# Patient Record
Sex: Female | Born: 1980
Health system: Southern US, Community
[De-identification: ages and names within clinical notes are randomized; demographics above are authoritative.]

---

## 2017-10-26 ENCOUNTER — Emergency Department (HOSPITAL_COMMUNITY): Payer: 59

## 2017-10-26 ENCOUNTER — Encounter (HOSPITAL_COMMUNITY): Payer: Self-pay | Admitting: Emergency Medicine

## 2017-10-26 ENCOUNTER — Emergency Department (HOSPITAL_COMMUNITY)
Admission: EM | Admit: 2017-10-26 | Discharge: 2017-10-26 | Disposition: A | Discharge status: Home / Self Care | Payer: 59 | Attending: Emergency Medicine | Admitting: Emergency Medicine

## 2017-10-26 DIAGNOSIS — F17200 Nicotine dependence, unspecified, uncomplicated: Secondary | ICD-10-CM | POA: Insufficient documentation

## 2017-10-26 DIAGNOSIS — J40 Bronchitis, not specified as acute or chronic: Secondary | ICD-10-CM | POA: Diagnosis not present

## 2017-10-26 DIAGNOSIS — M7918 Myalgia, other site: Secondary | ICD-10-CM | POA: Insufficient documentation

## 2017-10-26 DIAGNOSIS — R05 Cough: Secondary | ICD-10-CM | POA: Diagnosis present

## 2017-10-26 MED ORDER — IPRATROPIUM-ALBUTEROL 0.5-2.5 (3) MG/3ML IN SOLN
3.0000 mL | Freq: Once | RESPIRATORY_TRACT | Status: AC
  Administered 2017-10-26: 3 mL via RESPIRATORY_TRACT
  Filled 2017-10-26: qty 3

## 2017-10-26 MED ORDER — BENZONATATE 100 MG PO CAPS: 100.0000 mg | ORAL_CAPSULE | Freq: Three times a day (TID) | ORAL | 0 refills | Status: AC

## 2017-10-26 MED ORDER — PREDNISONE 10 MG PO TABS: 40.0000 mg | ORAL_TABLET | Freq: Every day | ORAL | 0 refills | Status: AC

## 2017-10-26 MED ORDER — AZITHROMYCIN 250 MG PO TABS: ORAL_TABLET | ORAL | 0 refills | Status: AC

## 2017-10-26 NOTE — ED Provider Notes (Signed)
[facility]-EMERGENCY DEPT Provider Note   CSN: [id number] Arrival date & time: 10/26/17  1526     History   Chief Complaint Chief Complaint  Patient presents with  . Cough  . Generalized Body Aches    HPI Sabrina Chandler is a 37 y.o. female every day smoker who presents to the ED with flu like symptoms. Patient reports a productive cough with yellow/green sputum. She reports chills and thinks she had fever at home. Patient has been taking motrin for fever and aches.   The history is provided by the patient. No language interpreter was used.  Cough  This is a new problem. The current episode started more than 2 days ago. The cough is productive of purulent sputum. The maximum temperature recorded prior to her arrival was 100 to 100.9 F. Associated symptoms include chills, headaches, sore throat and myalgias. Pertinent negatives include no chest pain and no shortness of breath. She has tried decongestants for the symptoms. The treatment provided mild relief. She is a smoker.    History reviewed. No pertinent past medical history.  There are no active problems to display for this patient.   History reviewed. No pertinent surgical history.   OB History   None      Home Medications    Prior to Admission medications   Medication Sig Start Date End Date Taking? Authorizing Provider  Pseudoeph-Doxylamine-DM-APAP (NYQUIL PO) Take 2 capsules by mouth at bedtime as needed (cold and flu symptoms).   Yes [provider]  Pseudoephedrine-APAP-DM (DAYQUIL PO) Take 2 capsules by mouth daily as needed (cold and flu symptoms).   Yes [provider]  azithromycin (ZITHROMAX Z-PAK) 250 MG tablet Take the first 2 tablets now and then one tablet PO daily 10/26/17   Janne Napoleon, NP  benzonatate (TESSALON) 100 MG capsule Take 1 capsule (100 mg total) by mouth every 8 (eight) hours. 10/26/17   Janne Napoleon, NP  predniSONE (DELTASONE) 10 MG tablet Take 4  tablets (40 mg total) by mouth daily with breakfast. 10/26/17   Janne Napoleon, NP    Family History No family history on file.  Social History Social History   Tobacco Use  . Smoking status: Never Smoker  . Smokeless tobacco: Never Used  Substance Use Topics  . Alcohol use: Not on file  . Drug use: Not on file     Allergies   Patient has no known allergies.   Review of Systems Review of Systems  Constitutional: Positive for chills and fever.  HENT: Positive for congestion and sore throat.   Eyes: Negative for pain, discharge and visual disturbance.  Respiratory: Positive for cough. Negative for shortness of breath.   Cardiovascular: Negative for chest pain.  Gastrointestinal: Negative for abdominal pain and vomiting.  Genitourinary: Negative for dysuria and urgency.  Musculoskeletal: Positive for myalgias. Negative for back pain and neck pain.  Skin: Negative for rash.  Neurological: Positive for headaches. Negative for syncope.  Psychiatric/Behavioral: Negative for confusion.     Physical Exam Updated Vital Signs BP 100/72 (BP Location: Left Arm)   Pulse 91   Temp 99.1 F (37.3 C) (Oral)   Resp 14   LMP 10/24/2017   SpO2 98%   Physical Exam  Constitutional: She appears well-developed and well-nourished. No distress.  HENT:  Head: Normocephalic and atraumatic.  Right Ear: Tympanic membrane normal.  Left Ear: Tympanic membrane normal.  Nose: Mucosal edema and rhinorrhea present.  Mouth/Throat: Uvula is midline and  mucous membranes are normal. Posterior oropharyngeal erythema present. No posterior oropharyngeal edema.  Eyes: EOM are normal.  Neck: Neck supple.  Cardiovascular: Normal rate and regular rhythm.  Pulmonary/Chest: Effort normal. She has decreased breath sounds.  Abdominal: Soft. There is no tenderness.  Musculoskeletal: Normal range of motion.  Neurological: She is alert.  Skin: Skin is warm and dry.  Psychiatric: She has a normal mood and  affect. Her behavior is normal.  Nursing note and vitals reviewed.    ED Treatments / Results  Labs (all labs ordered are listed, but only abnormal results are displayed) Labs Reviewed - No data to display  Radiology Dg Chest 2 View  Result Date: 10/26/2017 CLINICAL DATA:  Shortness of breath with chest discomfort, congestion and coughing 3 days. EXAM: CHEST - 2 VIEW COMPARISON:  None. FINDINGS: The heart size and mediastinal contours are within normal limits. Both lungs are clear. The visualized skeletal structures are unremarkable. IMPRESSION: No active cardiopulmonary disease. Electronically Signed   By: Elberta Fortisaniel  Boyle M.D.   On: 10/26/2017 17:54    Procedures Procedures (including critical care time)  Medications Ordered in ED Medications  ipratropium-albuterol (DUONEB) 0.5-2.5 (3) MG/3ML nebulizer solution 3 mL (3 mLs Nebulization Given 10/26/17 1749)     Initial Impression / Assessment and Plan / ED Course  I have reviewed the triage vital signs and the nursing notes. Pt CXR negative for acute infiltrate. Patients symptoms are consistent with bronchitis. Discussed that since she is an every day smoker she is at increased risk for infection. Will treat with Tessalon, Z-pak and short course of prednisone. Patient verbalizes understanding and is agreeable with plan. No respiratory distress and 02 SAT 98% on R/A.  Pt is hemodynamically stable & in NAD prior to dc.  Final Clinical Impressions(s) / ED Diagnoses   Final diagnoses:  Bronchitis    ED Discharge Orders         Ordered    benzonatate (TESSALON) 100 MG capsule  Every 8 hours     10/26/17 1802    azithromycin (ZITHROMAX Z-PAK) 250 MG tablet     10/26/17 1802    predniSONE (DELTASONE) 10 MG tablet  Daily with breakfast     10/26/17 1802           Damian Leavelleese, ReddellHope M, NP 10/26/17 1809    Lorre NickAllen, Anthony, MD 10/26/17 2337

## 2017-10-26 NOTE — Discharge Instructions (Addendum)
Take the medication as directed. Follow up with your doctor in the next few days for recheck. Return here as needed.

## 2019-07-22 IMAGING — CR DG CHEST 2V
2 series · 2 of 2 positions shown · non-contrast
Comparison: None.

CLINICAL DATA: Shortness of breath with chest discomfort,
congestion and coughing 3 days.

EXAM:
CHEST - 2 VIEW

[w chest pa]
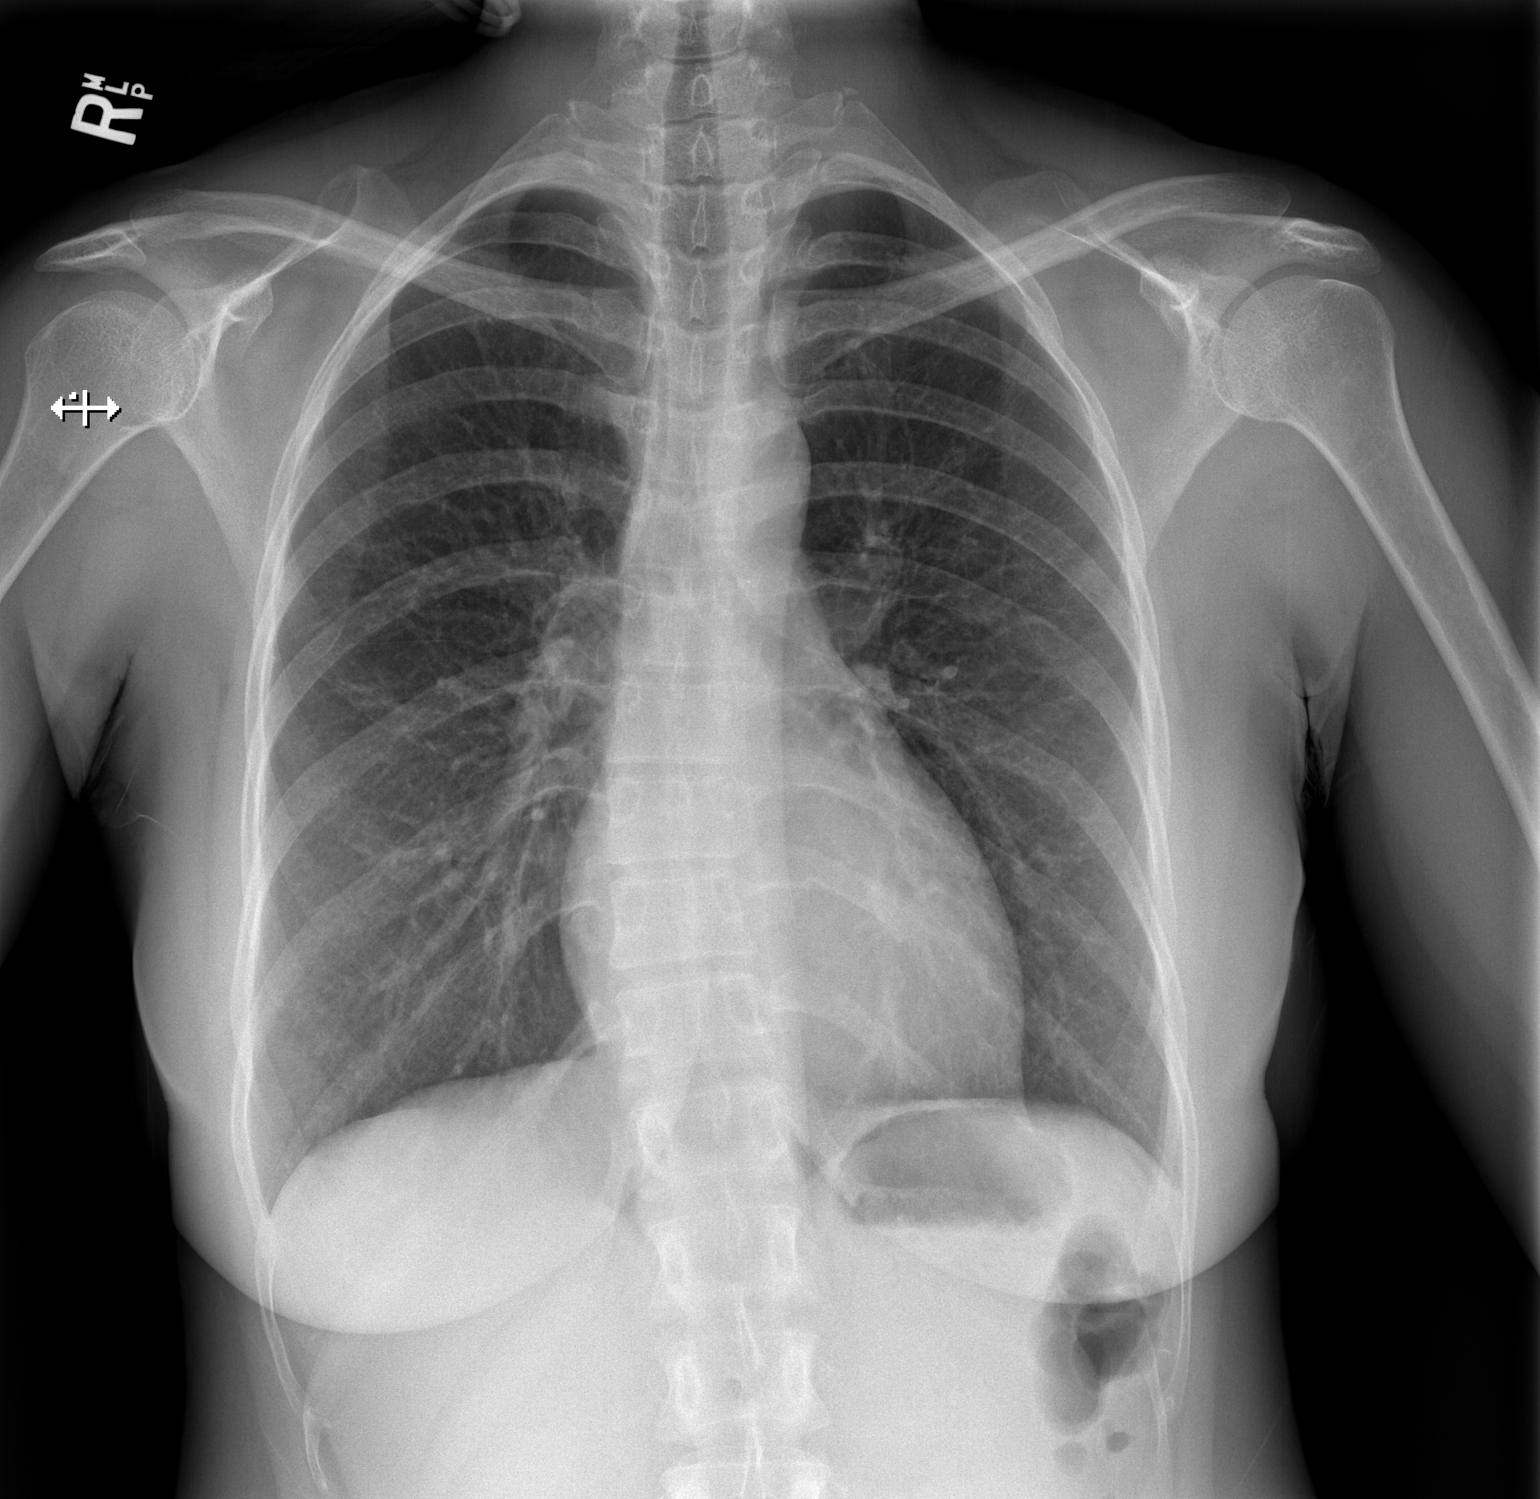

[w chest lat]
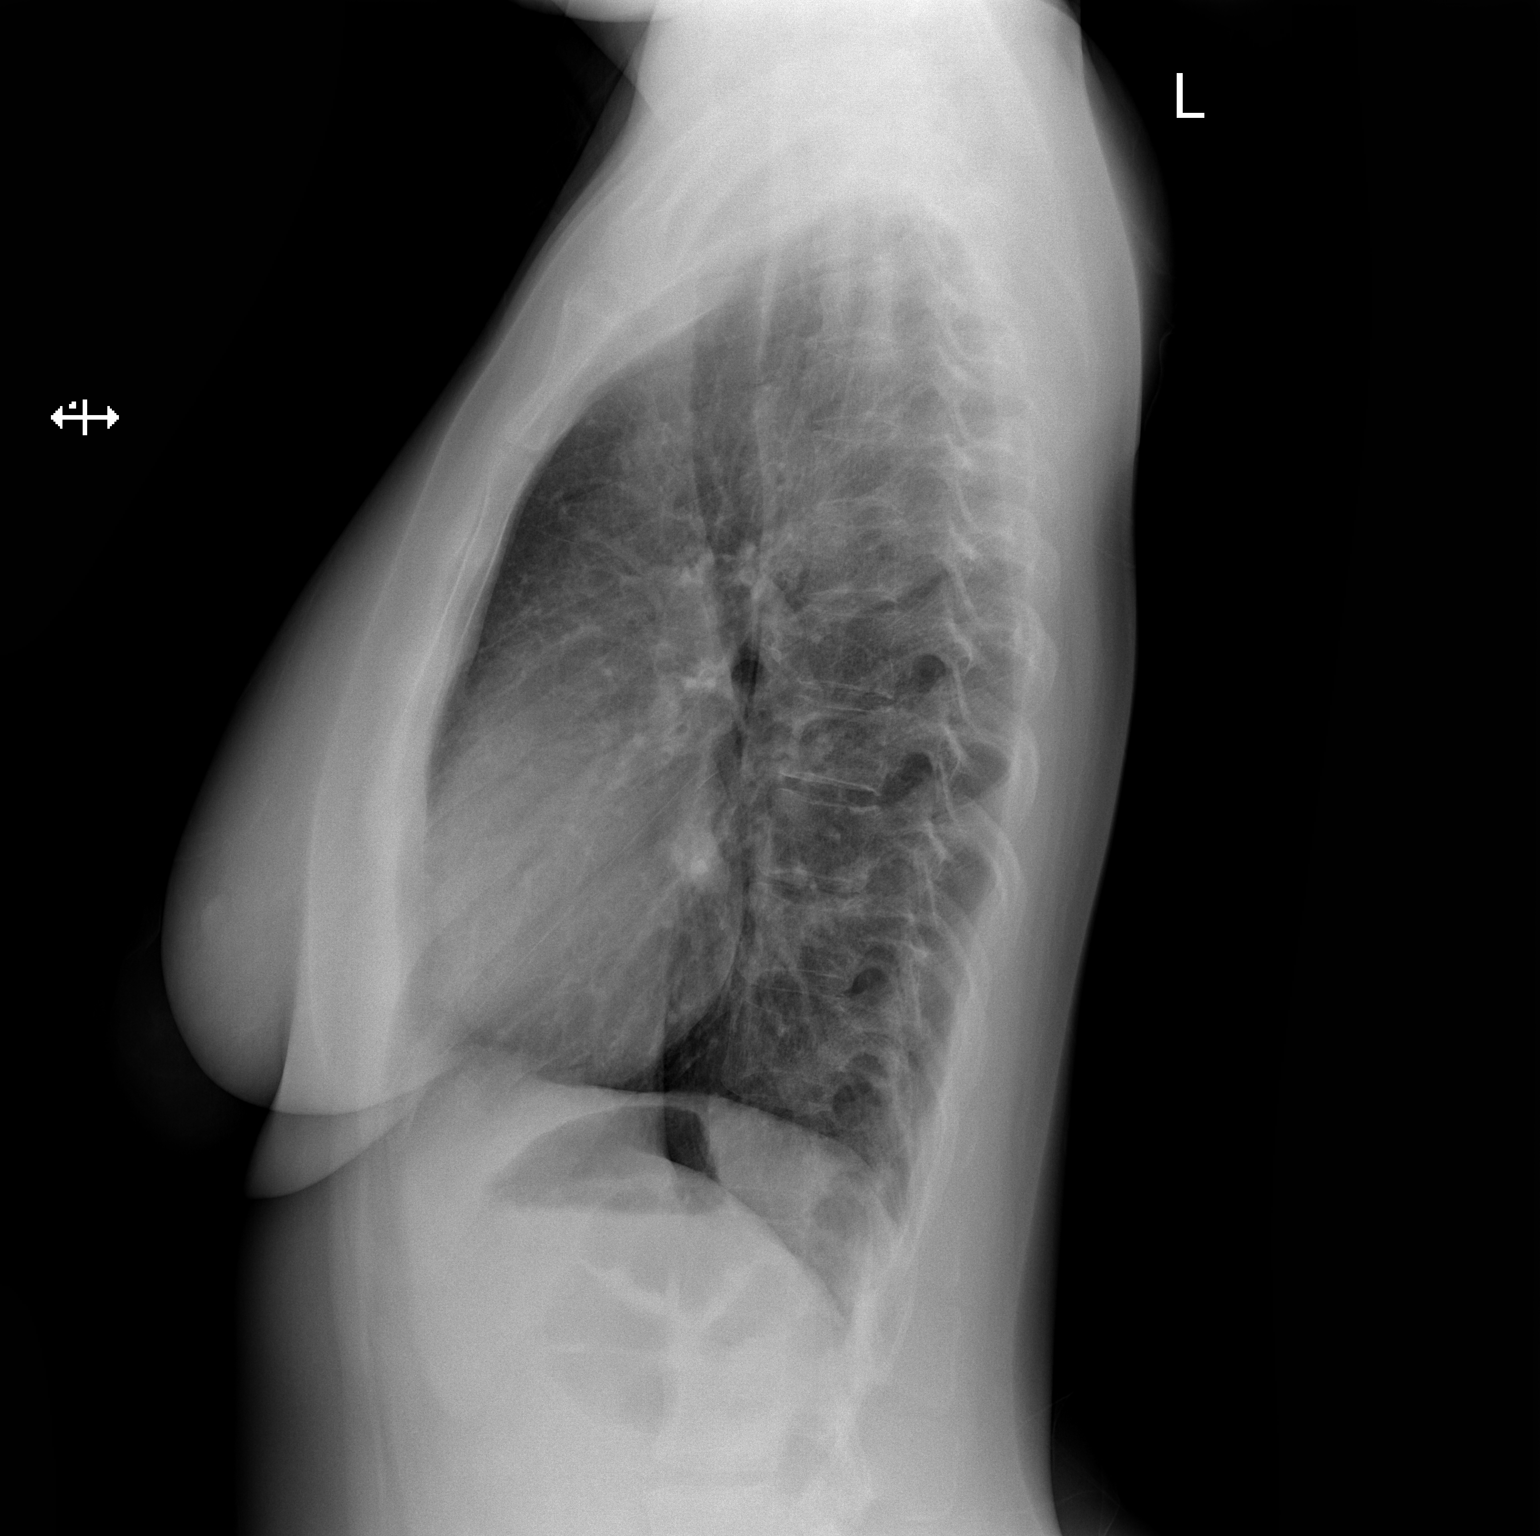

[2 of 2 positions shown; findings below may reference images not displayed]

FINDINGS: The heart size and mediastinal contours are within normal limits.
Both lungs are clear. The visualized skeletal structures are
unremarkable.
IMPRESSION: No active cardiopulmonary disease.
